# Patient Record
Sex: Male | Born: 1995 | Race: White | Hispanic: No | Marital: Single | Smoking: Never smoker
Health system: Southern US, Community
[De-identification: ages and names within clinical notes are randomized; demographics above are authoritative.]

## PROBLEM LIST (undated history)

## (undated) DIAGNOSIS — F950 Transient tic disorder: Secondary | ICD-10-CM

## (undated) DIAGNOSIS — F909 Attention-deficit hyperactivity disorder, unspecified type: Secondary | ICD-10-CM

## (undated) DIAGNOSIS — E669 Obesity, unspecified: Secondary | ICD-10-CM

## (undated) HISTORY — DX: Attention-deficit hyperactivity disorder, unspecified type: F90.9

## (undated) HISTORY — PX: TONSILLECTOMY: SUR1361

## (undated) HISTORY — PX: INGUINAL HERNIA REPAIR: SUR1180

## (undated) HISTORY — DX: Obesity, unspecified: E66.9

## (undated) HISTORY — DX: Transient tic disorder: F95.0

---
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
## 2019-10-10 ENCOUNTER — Ambulatory Visit (HOSPITAL_COMMUNITY)
Admission: EM | Admit: 2019-10-10 | Discharge: 2019-10-10 | Disposition: A | Discharge status: Home / Self Care | Payer: BLUE CROSS/BLUE SHIELD | Attending: Family Medicine | Admitting: Family Medicine

## 2019-10-10 ENCOUNTER — Encounter (HOSPITAL_COMMUNITY): Payer: Self-pay

## 2019-10-10 ENCOUNTER — Other Ambulatory Visit: Payer: Self-pay

## 2019-10-10 ENCOUNTER — Ambulatory Visit (INDEPENDENT_AMBULATORY_CARE_PROVIDER_SITE_OTHER): Payer: BLUE CROSS/BLUE SHIELD

## 2019-10-10 DIAGNOSIS — S161XXA Strain of muscle, fascia and tendon at neck level, initial encounter: Secondary | ICD-10-CM

## 2019-10-10 DIAGNOSIS — M25552 Pain in left hip: Secondary | ICD-10-CM | POA: Diagnosis not present

## 2019-10-10 DIAGNOSIS — M25551 Pain in right hip: Secondary | ICD-10-CM

## 2019-10-10 DIAGNOSIS — M25562 Pain in left knee: Secondary | ICD-10-CM

## 2019-10-10 MED ORDER — IBUPROFEN 800 MG PO TABS: 800.0000 mg | ORAL_TABLET | Freq: Three times a day (TID) | ORAL | 0 refills | Status: DC

## 2019-10-10 MED ORDER — CYCLOBENZAPRINE HCL 5 MG PO TABS: 5.0000 mg | ORAL_TABLET | Freq: Two times a day (BID) | ORAL | 0 refills | Status: DC | PRN

## 2019-10-10 NOTE — Discharge Instructions (Signed)
Use anti-inflammatories for pain/swelling. You may take up to 800 mg Ibuprofen every 8 hours with food. You may supplement Ibuprofen with Tylenol [phone] mg every 8 hours.   You may use flexeril as needed to help with pain. This is a muscle relaxer and causes sedation- please use only at bedtime or when you will be home and not have to drive/work  Ice and Heat to back Elevate leg  Avoid heavy lifting  Follow up if not getting better or worsening
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
## 2021-12-22 ENCOUNTER — Encounter (HOSPITAL_COMMUNITY): Payer: Self-pay

## 2021-12-22 ENCOUNTER — Other Ambulatory Visit: Payer: Self-pay

## 2021-12-22 ENCOUNTER — Ambulatory Visit (HOSPITAL_COMMUNITY)
Admission: EM | Admit: 2021-12-22 | Discharge: 2021-12-22 | Disposition: A | Discharge status: Home / Self Care | Payer: Managed Care, Other (non HMO) | Attending: Family Medicine | Admitting: Family Medicine

## 2021-12-22 DIAGNOSIS — J069 Acute upper respiratory infection, unspecified: Secondary | ICD-10-CM | POA: Diagnosis not present

## 2021-12-22 DIAGNOSIS — H6693 Otitis media, unspecified, bilateral: Secondary | ICD-10-CM

## 2021-12-22 MED ORDER — AMOXICILLIN 875 MG PO TABS: 875.0000 mg | ORAL_TABLET | Freq: Two times a day (BID) | ORAL | 0 refills | Status: AC

## 2021-12-22 NOTE — ED Triage Notes (Signed)
Pt presents to the office for facial pain and pressure that started last night.

## 2021-12-22 NOTE — Discharge Instructions (Signed)
You do have bilateral ear infections.  I also think you have a head cold due to a virus most likely.  Take amoxicillin 875 mg 1 twice daily for 10 days.  You may also take Tylenol or ibuprofen over-the-counter as needed for pain.

## 2021-12-22 NOTE — ED Provider Notes (Signed)
[facility]    CSN: [id number] Arrival date & time: 12/22/21  1531      History   Chief Complaint Chief Complaint  Patient presents with   Fever    SINUS PRESSURE AND HEADACHE    HPI Dustin Atkins is a 26 y.o. male.    Fever Here for history of bilateral ear pain that began today.  He also had some sore throat and slight cough started today with some mild nasal congestion.  No fever or chills noted.  No vomiting or diarrhea  Past Medical History:  Diagnosis Date   ADHD (attention deficit hyperactivity disorder)    Obesity    Tic disorder, transient of childhood     Patient Active Problem List   Diagnosis Date Noted   Allergic reaction 04/29/2013   Rhinitis 01/02/2013   Tourette syndrome 10/14/2011   ADHD (attention deficit hyperactivity disorder) 05/26/2011    Past Surgical History:  Procedure Laterality Date   INGUINAL HERNIA REPAIR     TONSILLECTOMY         Home Medications    Prior to Admission medications   Medication Sig Start Date End Date Taking? Authorizing Provider  amoxicillin (AMOXIL) 875 MG tablet Take 1 tablet (875 mg total) by mouth 2 (two) times daily for 10 days. 12/22/21 01/01/22 Yes Zenia Resides, MD    Family History Family History  Problem Relation Age of Onset   Healthy Mother    Heart disease Father        MI at age 79    Social History Social History   Tobacco Use   Smoking status: Never   Smokeless tobacco: Never  Vaping Use   Vaping Use: Never used  Substance Use Topics   Alcohol use: No   Drug use: No     Allergies   Patient has no known allergies.   Review of Systems Review of Systems  Constitutional:  Positive for fever.    Physical Exam Triage Vital Signs ED Triage Vitals [12/22/21 1637]  Enc Vitals Group     BP 128/66     Pulse Rate 77     Resp 18     Temp 98.6 F (37 C)     Temp src      SpO2 99 %     Weight      Height      Head Circumference      Peak Flow      Pain  Score      Pain Loc      Pain Edu?      Excl. in GC?    No data found.  Updated Vital Signs BP 128/66 (BP Location: Left Arm)    Pulse 77    Temp 98.6 F (37 C)    Resp 18    SpO2 99%   Visual Acuity Right Eye Distance:   Left Eye Distance:   Bilateral Distance:    Right Eye Near:   Left Eye Near:    Bilateral Near:     Physical Exam Vitals reviewed.  Constitutional:      General: He is not in acute distress.    Appearance: He is not toxic-appearing.  HENT:     Right Ear: Ear canal normal.     Left Ear: Ear canal normal.     Ears:     Comments: Bilateral TM's are red and dull     Nose: Nose normal.     Mouth/Throat:  Mouth: Mucous membranes are moist.     Pharynx: No oropharyngeal exudate or posterior oropharyngeal erythema.  Eyes:     Extraocular Movements: Extraocular movements intact.     Conjunctiva/sclera: Conjunctivae normal.     Pupils: Pupils are equal, round, and reactive to light.  Cardiovascular:     Rate and Rhythm: Normal rate and regular rhythm.     Heart sounds: No murmur heard. Pulmonary:     Effort: Pulmonary effort is normal.     Breath sounds: Normal breath sounds.  Musculoskeletal:     Cervical back: Neck supple.  Lymphadenopathy:     Cervical: No cervical adenopathy.  Skin:    Capillary Refill: Capillary refill takes less than 2 seconds.     Coloration: Skin is not jaundiced or pale.  Neurological:     General: No focal deficit present.     Mental Status: He is alert and oriented to person, place, and time.  Psychiatric:        Behavior: Behavior normal.     UC Treatments / Results  Labs (all labs ordered are listed, but only abnormal results are displayed) Labs Reviewed - No data to display  EKG   Radiology No results found.  Procedures Procedures (including critical care time)  Medications Ordered in UC Medications - No data to display  Initial Impression / Assessment and Plan / UC Course  I have reviewed the  triage vital signs and the nursing notes.  Pertinent labs & imaging results that were available during my care of the patient were reviewed by me and considered in my medical decision making (see chart for details).     Will treat the OM. Discussed the URI symptoms are most likely due to  a virus Final Clinical Impressions(s) / UC Diagnoses   Final diagnoses:  Bilateral otitis media, unspecified otitis media type  Viral upper respiratory tract infection     Discharge Instructions      You do have bilateral ear infections.  I also think you have a head cold due to a virus most likely.  Take amoxicillin 875 mg 1 twice daily for 10 days.  You may also take Tylenol or ibuprofen over-the-counter as needed for pain.     ED Prescriptions     Medication Sig Dispense Auth. Provider   amoxicillin (AMOXIL) 875 MG tablet Take 1 tablet (875 mg total) by mouth 2 (two) times daily for 10 days. 20 tablet Mikael Debell, Janace Aris, MD      PDMP not reviewed this encounter.   Zenia Resides, MD 12/22/21 [phone]

## 2022-12-03 ENCOUNTER — Ambulatory Visit (HOSPITAL_COMMUNITY)
Admission: EM | Admit: 2022-12-03 | Discharge: 2022-12-03 | Disposition: A | Discharge status: Home / Self Care | Payer: Managed Care, Other (non HMO) | Attending: Family Medicine | Admitting: Family Medicine

## 2022-12-03 DIAGNOSIS — R197 Diarrhea, unspecified: Secondary | ICD-10-CM

## 2022-12-03 NOTE — ED Triage Notes (Signed)
3-day h/o diarrhea and nausea. Pt reports diarrhea is better today. Pt needs a note to return to work.

## 2022-12-03 NOTE — Discharge Instructions (Signed)
Please do your best to ensure adequate fluid intake in order to avoid dehydration. If you find that you are unable to tolerate drinking fluids regularly please proceed to the Emergency Department for evaluation. ° ° °

## 2022-12-03 NOTE — ED Provider Notes (Signed)
  [place]   [id number] 12/03/22 Arrival Time: 1632  ASSESSMENT & PLAN:  1. Diarrhea, unspecified type    Improving. Work note provided.   Discharge Instructions      Please do your best to ensure adequate fluid intake in order to avoid dehydration. If you find that you are unable to tolerate drinking fluids regularly please proceed to the Emergency Department for evaluation.   No signs of dehydration. Tolerating PO fluids. No current nausea.  Reviewed expectations re: course of current medical issues. Questions answered. Outlined signs and symptoms indicating need for more acute intervention. Patient verbalized understanding. After Visit Summary given.   SUBJECTIVE: History from: patient.  [place] Stordahl is a 27 y.o. male who presents with complaint of non-bloody diarrhea after travel to [place]; x 2-3 days; much better today. No abdominal discomfort. Denies fever. Tolerating PO intake. No n/v.   Past Surgical History:  Procedure Laterality Date   INGUINAL HERNIA REPAIR     TONSILLECTOMY     OBJECTIVE:  Vitals:   12/03/22 1919  BP: (!) 147/84  Pulse: 77  Resp: 16  Temp: 97.8 F (36.6 C)  TempSrc: Oral  SpO2: 100%    General appearance: alert; no distress Oropharynx: moist Abdomen: non-distended Back: no CVA tenderness Extremities: no edema; symmetrical with no gross deformities Skin: warm; dry Neurologic: normal gait Psychological: alert and cooperative; normal mood and affect  No Known Allergies                                             Past Medical History:  Diagnosis Date   ADHD (attention deficit hyperactivity disorder)    Obesity    Tic disorder, transient of childhood    Social History   Socioeconomic History   Marital status: Single    Spouse name: Not on file   Number of children: Not on file   Years of education: Not on file   Highest education level: Not on file  Occupational History   Not on file  Tobacco Use    Smoking status: Never   Smokeless tobacco: Never  Vaping Use   Vaping Use: Never used  Substance and Sexual Activity   Alcohol use: No   Drug use: No   Sexual activity: Never  Other Topics Concern   Not on file  Social History Narrative   Not on file   Social Determinants of Health   Financial Resource Strain: Not on file  Food Insecurity: Not on file  Transportation Needs: Not on file  Physical Activity: Not on file  Stress: Not on file  Social Connections: Not on file  Intimate Partner Violence: Not on file   Family History  Problem Relation Age of Onset   Healthy Mother    Heart disease Father        MI at age 16      Vanessa Kick, MD 12/03/22 1939
